# Patient Record
Sex: Female | Born: 1985 | Race: Black or African American | Hispanic: No | Marital: Single | Smoking: Former smoker
Health system: Southern US, Community
[De-identification: ages and names within clinical notes are randomized; demographics above are authoritative.]

## PROBLEM LIST (undated history)

## (undated) DIAGNOSIS — F329 Major depressive disorder, single episode, unspecified: Secondary | ICD-10-CM

## (undated) DIAGNOSIS — F319 Bipolar disorder, unspecified: Secondary | ICD-10-CM

## (undated) DIAGNOSIS — K219 Gastro-esophageal reflux disease without esophagitis: Secondary | ICD-10-CM

## (undated) DIAGNOSIS — F32A Depression, unspecified: Secondary | ICD-10-CM

## (undated) DIAGNOSIS — N809 Endometriosis, unspecified: Secondary | ICD-10-CM

---
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
## 2019-05-07 ENCOUNTER — Encounter (HOSPITAL_COMMUNITY): Payer: Self-pay | Admitting: Emergency Medicine

## 2019-05-07 ENCOUNTER — Emergency Department (HOSPITAL_COMMUNITY)
Admission: EM | Admit: 2019-05-07 | Discharge: 2019-05-07 | Disposition: A | Discharge status: Home / Self Care | Payer: Medicaid Other | Attending: Emergency Medicine | Admitting: Emergency Medicine

## 2019-05-07 ENCOUNTER — Other Ambulatory Visit: Payer: Self-pay

## 2019-05-07 DIAGNOSIS — R1010 Upper abdominal pain, unspecified: Secondary | ICD-10-CM | POA: Insufficient documentation

## 2019-05-07 DIAGNOSIS — N898 Other specified noninflammatory disorders of vagina: Secondary | ICD-10-CM | POA: Diagnosis not present

## 2019-05-07 DIAGNOSIS — R1030 Lower abdominal pain, unspecified: Secondary | ICD-10-CM | POA: Diagnosis not present

## 2019-05-07 DIAGNOSIS — Z87891 Personal history of nicotine dependence: Secondary | ICD-10-CM | POA: Insufficient documentation

## 2019-05-07 HISTORY — DX: Endometriosis, unspecified: N80.9

## 2019-05-07 HISTORY — DX: Gastro-esophageal reflux disease without esophagitis: K21.9

## 2019-05-07 HISTORY — DX: Bipolar disorder, unspecified: F31.9

## 2019-05-07 HISTORY — DX: Depression, unspecified: F32.A

## 2019-05-07 LAB — CBC
HCT: 42.5 % (ref 36.0–46.0)
Hemoglobin: 14.1 g/dL (ref 12.0–15.0)
MCH: 29 pg (ref 26.0–34.0)
MCHC: 33.2 g/dL (ref 30.0–36.0)
MCV: 87.3 fL (ref 80.0–100.0)
Platelets: 247 10*3/uL (ref 150–400)
RBC: 4.87 MIL/uL (ref 3.87–5.11)
RDW: 13.4 % (ref 11.5–15.5)
WBC: 9.1 10*3/uL (ref 4.0–10.5)
nRBC: 0 % (ref 0.0–0.2)

## 2019-05-07 LAB — COMPREHENSIVE METABOLIC PANEL
ALT: 18 U/L (ref 0–44)
AST: 17 U/L (ref 15–41)
Albumin: 4.6 g/dL (ref 3.5–5.0)
Alkaline Phosphatase: 85 U/L (ref 38–126)
Anion gap: 12 (ref 5–15)
BUN: 14 mg/dL (ref 6–20)
CO2: 24 mmol/L (ref 22–32)
Calcium: 9.2 mg/dL (ref 8.9–10.3)
Chloride: 105 mmol/L (ref 98–111)
Creatinine, Ser: 0.89 mg/dL (ref 0.44–1.00)
GFR calc Af Amer: >60 mL/min (ref >60)
GFR calc non Af Amer: >60 mL/min (ref >60)
Glucose, Bld: 130 mg/dL — ABNORMAL HIGH (ref 70–99)
Potassium: 3.9 mmol/L (ref 3.5–5.1)
Sodium: 141 mmol/L (ref 135–145)
Total Bilirubin: 0.5 mg/dL (ref 0.3–1.2)
Total Protein: 7.5 g/dL (ref 6.5–8.1)

## 2019-05-07 LAB — URINALYSIS, ROUTINE W REFLEX MICROSCOPIC
Bilirubin Urine: NEGATIVE
Glucose, UA: NEGATIVE mg/dL
Ketones, ur: NEGATIVE mg/dL
Leukocytes,Ua: NEGATIVE
Nitrite: NEGATIVE
Protein, ur: NEGATIVE mg/dL
Specific Gravity, Urine: 1.015 (ref 1.005–1.030)
pH: 7.5 (ref 5.0–8.0)

## 2019-05-07 LAB — URINALYSIS, MICROSCOPIC (REFLEX): RBC / HPF: >50 RBC/hpf (ref 0–5)

## 2019-05-07 LAB — I-STAT BETA HCG BLOOD, ED (MC, WL, AP ONLY): I-stat hCG, quantitative: <5 m[IU]/mL (ref <5)

## 2019-05-07 LAB — LIPASE, BLOOD: Lipase: 34 U/L (ref 11–51)

## 2019-05-07 MED ORDER — MORPHINE SULFATE (PF) 4 MG/ML IV SOLN
4.0000 mg | Freq: Once | INTRAVENOUS | Status: AC
  Administered 2019-05-07: 4 mg via INTRAVENOUS
  Filled 2019-05-07: qty 1

## 2019-05-07 MED ORDER — NAPROXEN 500 MG PO TABS: 500.0000 mg | ORAL_TABLET | Freq: Two times a day (BID) | ORAL | 0 refills | Status: AC

## 2019-05-07 MED ORDER — KETOROLAC TROMETHAMINE 15 MG/ML IJ SOLN
15.0000 mg | Freq: Once | INTRAMUSCULAR | Status: AC
  Administered 2019-05-07: 15 mg via INTRAVENOUS
  Filled 2019-05-07: qty 1

## 2019-05-07 MED ORDER — SODIUM CHLORIDE 0.9% FLUSH
3.0000 mL | Freq: Once | INTRAVENOUS | Status: AC
  Administered 2019-05-07: 3 mL via INTRAVENOUS

## 2019-05-07 MED ORDER — SODIUM CHLORIDE 0.9 % IV BOLUS
1000.0000 mL | Freq: Once | INTRAVENOUS | Status: AC
  Administered 2019-05-07: 11:00:00 1000 mL via INTRAVENOUS

## 2019-05-07 NOTE — ED Notes (Signed)
Patient verbalized understanding of discharge instructions and ambulated to lobby. Reports boyfriend will be driving her home.

## 2019-05-07 NOTE — Discharge Instructions (Addendum)
Your laboratory results were within normal limits.   I have provided medication to help with your pain, please take 1 tablet twice a day for the next 7 days.  I have provided the number to gynecology you will need to follow up with them for further management of your endometriosis.

## 2019-05-07 NOTE — ED Triage Notes (Signed)
abd pain x 3 days some nausea , vag bleeding  Upper denies dysuria ,

## 2019-05-07 NOTE — ED Provider Notes (Signed)
[facility] EMERGENCY DEPARTMENT Provider Note   CSN: [id number] Arrival date & time: 05/07/19  8841     History Chief Complaint  Patient presents with  . Abdominal Pain    Wanda Jordan is a 34 y.o. female.  34 y.o female with a PMH of endometriosis with a chief complaint of lower abdominal x but cramping on upper abdomen.  She had two prior laparoscopies for endometriosis, and third procedure for surgical removal of Mirena IUD. Brown discharge began Friday, heavy like I was starting period. LMP 02/02-02/07. Was told she needed a hysterectomy but recently relocated here from [place].   The history is provided by the patient.  Abdominal Pain Pain location:  Suprapubic and epigastric Pain quality: cramping   Pain radiates to:  Does not radiate Pain severity:  Moderate Onset quality:  Sudden Duration:  3 days Timing:  Constant Progression:  Worsening Chronicity:  Recurrent Context: previous surgery   Context: not sick contacts and not suspicious food intake   Relieved by:  Nothing Worsened by:  Eating Ineffective treatments:  Acetaminophen, NSAIDs and lying down Associated symptoms: vaginal discharge   Associated symptoms: no anorexia, no chest pain, no chills, no constipation, no diarrhea, no dysuria, no fever, no nausea, no shortness of breath and no sore throat   Risk factors: multiple surgeries   Risk factors: no alcohol abuse and not pregnant        Past Medical History:  Diagnosis Date  . Bipolar 1 disorder (HCC)   . Depression   . Endometriosis   . GERD (gastroesophageal reflux disease)     There are no problems to display for this patient.    OB History   No obstetric history on file.     No family history on file.  Social History   Tobacco Use  . Smoking status: Former Games developer  . Smokeless tobacco: Never Used  Substance Use Topics  . Alcohol use: Not Currently  . Drug use: Not Currently    Home Medications Prior to  Admission medications   Medication Sig Start Date End Date Taking? Authorizing Provider  naproxen (NAPROSYN) 500 MG tablet Take 1 tablet (500 mg total) by mouth 2 (two) times daily for 7 days. 05/07/19 05/14/19  Claude Manges, PA-C    Allergies    Benadryl [diphenhydramine]  Review of Systems   Review of Systems  Constitutional: Negative for chills and fever.  HENT: Negative for sore throat.   Respiratory: Negative for shortness of breath.   Cardiovascular: Negative for chest pain.  Gastrointestinal: Positive for abdominal pain. Negative for anorexia, constipation, diarrhea and nausea.  Genitourinary: Positive for vaginal discharge. Negative for dysuria.  All other systems reviewed and are negative.   Physical Exam Updated Vital Signs BP 110/76 (BP Location: Left Arm)   Pulse 89   Temp 98.4 F (36.9 C) (Oral)   Resp 16   Ht 5\' 4"  (1.626 m)   Wt 63.5 kg   SpO2 100%   BMI 24.03 kg/m   Physical Exam Vitals and nursing note reviewed.  Constitutional:      General: She is not in acute distress.    Appearance: She is well-developed.     Comments: Appears uncomfortable.   HENT:     Head: Normocephalic and atraumatic.     Mouth/Throat:     Pharynx: No oropharyngeal exudate.  Eyes:     Pupils: Pupils are equal, round, and reactive to light.  Cardiovascular:     Rate and  Rhythm: Regular rhythm.     Heart sounds: Normal heart sounds.  Pulmonary:     Effort: Pulmonary effort is normal. No respiratory distress.     Breath sounds: Normal breath sounds.  Abdominal:     General: Abdomen is flat. Bowel sounds are normal. There is no distension.     Palpations: Abdomen is soft. There is no hepatomegaly or splenomegaly.     Tenderness: There is generalized abdominal tenderness and tenderness in the suprapubic area. There is no right CVA tenderness, left CVA tenderness, guarding or rebound.     Hernia: No hernia is present.     Comments: Soft but ttp along the suprapubic region.     Musculoskeletal:        General: No tenderness or deformity.     Cervical back: Normal range of motion.     Right lower leg: No edema.     Left lower leg: No edema.  Skin:    General: Skin is warm and dry.  Neurological:     Mental Status: She is alert and oriented to person, place, and time.     ED Results / Procedures / Treatments   Labs (all labs ordered are listed, but only abnormal results are displayed) Labs Reviewed  COMPREHENSIVE METABOLIC PANEL - Abnormal; Notable for the following components:      Result Value   Glucose, Bld 130 (*)    All other components within normal limits  URINALYSIS, ROUTINE W REFLEX MICROSCOPIC - Abnormal; Notable for the following components:   APPearance CLOUDY (*)    Hgb urine dipstick LARGE (*)    All other components within normal limits  URINALYSIS, MICROSCOPIC (REFLEX) - Abnormal; Notable for the following components:   Bacteria, UA FEW (*)    All other components within normal limits  LIPASE, BLOOD  CBC  I-STAT BETA HCG BLOOD, ED ([facility], [facility], [facility] ONLY)    EKG None  Radiology No results found.  Procedures Procedures (including critical care time)  Medications Ordered in ED Medications  sodium chloride flush (NS) 0.9 % injection 3 mL (3 mLs Intravenous Given 05/07/19 1100)  ketorolac (TORADOL) 15 MG/ML injection 15 mg (15 mg Intravenous Given 05/07/19 1059)  sodium chloride 0.9 % bolus 1,000 mL (0 mLs Intravenous Stopped 05/07/19 1212)  morphine 4 MG/ML injection 4 mg (4 mg Intravenous Given 05/07/19 1239)    ED Course  I have reviewed the triage vital signs and the nursing notes.  Pertinent labs & imaging results that were available during my care of the patient were reviewed by me and considered in my medical decision making (see chart for details).    MDM Rules/Calculators/A&P   Patient with no PMH presents to the ED with a complaints of " endometriosis flareup ".  She does report she has been under a lot of stress and this  feels similar to her prior endometriosis flareup.  [place]  She does have a prior history of surgical laparoscopies for evaluation of endometriosis, was told she would need a hysterectomy at some point.  Has not gotten this procedure scheduled.  She also endorses some vaginal discharge, this began with brown heavy clots on Friday, reports this has worsened as she feels more lightheaded and tired now.  She has had some Tylenol, naproxen for her pain without improvement in her symptoms. She denies any nausea, vomiting, or diarrhea. Last BM yesterday. Currently sexually active, but had tubal ligation for birth control.  Interpretation of labs with a CBC is unremarkable, no signs of anemia despite her vaginal bleeding. CMP with no electrolyte abnormality.  Creatinine level is within normal limits.  UA with some large hemoglobin but no nitrites or leukocytes.  Few bacteria were present.  hCG is negative.  Attempted to perform a pelvic exam on patient however she declined this at this time stating " I am gay and do not want this".  She reports pain along the lower aspect, provided with Toradol reports pain has improved some.  12:33 PM Patient has refused pelvic along. I have lower suspicion of torsion as pain is recurrent as she reports the same as her prior endometriosis.  There has been no nausea, no vomiting, lower suspicion for any GI pathology as she has been eating and drinking adequately as she reports.  No fevers, overall well-appearing.  I feel that is reasonable to send patient home with a prescription for naproxen along with follow-up with GYN.  Patient is in agreement management, return precautions discussed at length.   Portions of this note were generated with Scientist, clinical (histocompatibility and immunogenetics). Dictation errors may occur despite best attempts at proofreading.  Final Clinical Impression(s) / ED Diagnoses Final diagnoses:  Lower abdominal pain    Rx / DC Orders ED  Discharge Orders         Ordered    naproxen (NAPROSYN) 500 MG tablet  2 times daily     05/07/19 1232           Claude Manges, PA-C 05/07/19 1320    Gwyneth Sprout, MD 05/07/19 1503

## 2019-07-14 ENCOUNTER — Other Ambulatory Visit: Payer: Self-pay

## 2019-07-14 ENCOUNTER — Encounter (HOSPITAL_COMMUNITY): Payer: Self-pay | Admitting: Emergency Medicine

## 2019-07-14 ENCOUNTER — Emergency Department (HOSPITAL_COMMUNITY)
Admission: EM | Admit: 2019-07-14 | Discharge: 2019-07-15 | Disposition: A | Discharge status: Home / Self Care | Payer: Medicaid Other | Attending: Emergency Medicine | Admitting: Emergency Medicine

## 2019-07-14 DIAGNOSIS — R9389 Abnormal findings on diagnostic imaging of other specified body structures: Secondary | ICD-10-CM

## 2019-07-14 DIAGNOSIS — N83201 Unspecified ovarian cyst, right side: Secondary | ICD-10-CM | POA: Insufficient documentation

## 2019-07-14 DIAGNOSIS — R102 Pelvic and perineal pain: Secondary | ICD-10-CM | POA: Diagnosis not present

## 2019-07-14 DIAGNOSIS — Z87891 Personal history of nicotine dependence: Secondary | ICD-10-CM | POA: Diagnosis not present

## 2019-07-14 DIAGNOSIS — K6289 Other specified diseases of anus and rectum: Secondary | ICD-10-CM | POA: Insufficient documentation

## 2019-07-14 DIAGNOSIS — R1900 Intra-abdominal and pelvic swelling, mass and lump, unspecified site: Secondary | ICD-10-CM

## 2019-07-14 NOTE — ED Triage Notes (Signed)
jpt c/o rectal pain since last Monday, was seen on UC  for same and sent home with prescription for Hydrocodone that is not helping.

## 2019-07-15 ENCOUNTER — Emergency Department (HOSPITAL_COMMUNITY): Payer: Medicaid Other

## 2019-07-15 ENCOUNTER — Encounter (HOSPITAL_COMMUNITY): Payer: Self-pay | Admitting: Radiology

## 2019-07-15 LAB — CBC WITH DIFFERENTIAL/PLATELET
Abs Immature Granulocytes: 0.03 10*3/uL (ref 0.00–0.07)
Basophils Absolute: 0.1 10*3/uL (ref 0.0–0.1)
Basophils Relative: 1 %
Eosinophils Absolute: 0.2 10*3/uL (ref 0.0–0.5)
Eosinophils Relative: 2 %
HCT: 39.7 % (ref 36.0–46.0)
Hemoglobin: 12.9 g/dL (ref 12.0–15.0)
Immature Granulocytes: 0 %
Lymphocytes Relative: 35 %
Lymphs Abs: 3.5 10*3/uL (ref 0.7–4.0)
MCH: 28.5 pg (ref 26.0–34.0)
MCHC: 32.5 g/dL (ref 30.0–36.0)
MCV: 87.6 fL (ref 80.0–100.0)
Monocytes Absolute: 0.6 10*3/uL (ref 0.1–1.0)
Monocytes Relative: 6 %
Neutro Abs: 5.4 10*3/uL (ref 1.7–7.7)
Neutrophils Relative %: 56 %
Platelets: 184 10*3/uL (ref 150–400)
RBC: 4.53 MIL/uL (ref 3.87–5.11)
RDW: 13.6 % (ref 11.5–15.5)
WBC: 9.7 10*3/uL (ref 4.0–10.5)
nRBC: 0 % (ref 0.0–0.2)

## 2019-07-15 LAB — COMPREHENSIVE METABOLIC PANEL
ALT: 12 U/L (ref 0–44)
AST: 13 U/L — ABNORMAL LOW (ref 15–41)
Albumin: 4.1 g/dL (ref 3.5–5.0)
Alkaline Phosphatase: 73 U/L (ref 38–126)
Anion gap: 10 (ref 5–15)
BUN: 16 mg/dL (ref 6–20)
CO2: 21 mmol/L — ABNORMAL LOW (ref 22–32)
Calcium: 9.1 mg/dL (ref 8.9–10.3)
Chloride: 109 mmol/L (ref 98–111)
Creatinine, Ser: 0.9 mg/dL (ref 0.44–1.00)
GFR calc Af Amer: >60 mL/min (ref >60)
GFR calc non Af Amer: >60 mL/min (ref >60)
Glucose, Bld: 108 mg/dL — ABNORMAL HIGH (ref 70–99)
Potassium: 3.9 mmol/L (ref 3.5–5.1)
Sodium: 140 mmol/L (ref 135–145)
Total Bilirubin: 0.5 mg/dL (ref 0.3–1.2)
Total Protein: 6.8 g/dL (ref 6.5–8.1)

## 2019-07-15 LAB — URINALYSIS, ROUTINE W REFLEX MICROSCOPIC
Bilirubin Urine: NEGATIVE
Glucose, UA: NEGATIVE mg/dL
Hgb urine dipstick: NEGATIVE
Ketones, ur: 5 mg/dL — AB
Leukocytes,Ua: NEGATIVE
Nitrite: NEGATIVE
Protein, ur: NEGATIVE mg/dL
Specific Gravity, Urine: 1.026 (ref 1.005–1.030)
pH: 6 (ref 5.0–8.0)

## 2019-07-15 LAB — LIPASE, BLOOD: Lipase: 33 U/L (ref 11–51)

## 2019-07-15 LAB — PREGNANCY, URINE: Preg Test, Ur: NEGATIVE

## 2019-07-15 MED ORDER — KETOROLAC TROMETHAMINE 30 MG/ML IJ SOLN
30.0000 mg | Freq: Once | INTRAMUSCULAR | Status: AC
  Administered 2019-07-15: 10:00:00 30 mg via INTRAVENOUS
  Filled 2019-07-15: qty 1

## 2019-07-15 MED ORDER — OXYCODONE-ACETAMINOPHEN 5-325 MG PO TABS
2.0000 | ORAL_TABLET | Freq: Once | ORAL | Status: AC
  Administered 2019-07-15: 2 via ORAL
  Filled 2019-07-15: qty 2

## 2019-07-15 MED ORDER — MORPHINE SULFATE (PF) 4 MG/ML IV SOLN
4.0000 mg | Freq: Once | INTRAVENOUS | Status: AC
  Administered 2019-07-15: 4 mg via INTRAVENOUS
  Filled 2019-07-15: qty 1

## 2019-07-15 MED ORDER — IOHEXOL 300 MG/ML  SOLN
100.0000 mL | Freq: Once | INTRAMUSCULAR | Status: AC | PRN
  Administered 2019-07-15: 100 mL via INTRAVENOUS

## 2019-07-15 MED ORDER — SODIUM CHLORIDE 0.9 % IV BOLUS (SEPSIS)
1000.0000 mL | Freq: Once | INTRAVENOUS | Status: AC
  Administered 2019-07-15: 1000 mL via INTRAVENOUS

## 2019-07-15 MED ORDER — ONDANSETRON HCL 4 MG/2ML IJ SOLN
4.0000 mg | Freq: Once | INTRAMUSCULAR | Status: AC
  Administered 2019-07-15: 4 mg via INTRAVENOUS
  Filled 2019-07-15: qty 2

## 2019-07-15 NOTE — Discharge Instructions (Addendum)
Please use Tylenol and ibuprofen as needed for pain Call your OB/GYN for follow-up Return to the emergency department if you are having severe pain, fever, or worsening symptoms

## 2019-07-15 NOTE — ED Provider Notes (Signed)
  Physical Exam  BP 138/74 (BP Location: Right Arm)   Pulse (!) 102   Temp 99 F (37.2 C) (Oral)   Resp 18   Ht 1.626 m (5\' 4" )   Wt 61.2 kg   SpO2 100%   BMI 23.16 kg/m   Physical Exam  ED Course/Procedures     Procedures  MDM  Received as handoff at shift change from Dr. .  She presented complaining of rectal pain.  CT obtained shows a deep pelvic mass for which ultrasound is being obtained for further evaluation.  Plan probable discharge with outpatient follow-up Reviewed ultrasound.  There is a hemorrhagic corpus luteum.  Discussed with Dr. Elesa Massed and this correlates with finding on CT. Discussed with patient and advised regarding follow-up with OB/GYN.      Pia Mau, MD 07/15/19 1056

## 2019-07-15 NOTE — ED Notes (Signed)
Pt transported to Ultrasound.  

## 2019-07-15 NOTE — ED Notes (Signed)
Pt returned to room from ultrasound.

## 2019-07-15 NOTE — ED Provider Notes (Addendum)
TIME SEEN: 5:11 AM  CHIEF COMPLAINT: Rectal pain, lower abdominal pain  HPI: Patient is a 34 year old female with history of endometriosis, bipolar disorder who presents to the emergency department with several days of rectal pain, lower abdominal cramping.  States pain initially started on Monday with episodes of diarrhea.  States now she feels she cannot have a bowel movement or pass gas.  No history of bowel obstruction but has had 2 previous laparoscopic surgeries for endometriosis.  She does report she has had nausea and vomiting.  Vomit has been nonbilious, nonbloody.  No fevers but feels she has had chills.  No bloody stools or melena.  No history of perianal or perirectal abscess.  No dysuria, hematuria, vaginal bleeding or discharge.  She is a G4 P3 A1.  No history of STDs.  Was seen at urgent care and given Anusol which she has been using without relief.  Does have history of hemorrhoids.  ROS: See HPI Constitutional: no fever  Eyes: no drainage  ENT: no runny nose   Cardiovascular:  no chest pain  Resp: no SOB  GI:  vomiting GU: no dysuria Integumentary: no rash  Allergy: no hives  Musculoskeletal: no leg swelling  Neurological: no slurred speech ROS otherwise negative  PAST MEDICAL HISTORY/PAST SURGICAL HISTORY:  Past Medical History:  Diagnosis Date  . Bipolar 1 disorder (HCC)   . Depression   . Endometriosis   . GERD (gastroesophageal reflux disease)     MEDICATIONS:  Prior to Admission medications   Not on File    ALLERGIES:  Allergies  Allergen Reactions  . Benadryl [Diphenhydramine]     twitches    SOCIAL HISTORY:  Social History   Tobacco Use  . Smoking status: Former Games developer  . Smokeless tobacco: Never Used  Substance Use Topics  . Alcohol use: Not Currently    FAMILY HISTORY: No family history on file.  EXAM: BP (!) 115/92 (BP Location: Right Arm)   Pulse (!) 107   Temp 99 F (37.2 C) (Oral)   Resp 18   Ht 5\' 4"  (1.626 m)   Wt 61.2 kg    SpO2 100%   BMI 23.16 kg/m  CONSTITUTIONAL: Alert and oriented and responds appropriately to questions.  Appears uncomfortable but not in distress.  Normal rectal temperature of 98.8. HEAD: Normocephalic EYES: Conjunctivae clear, pupils appear equal, EOM appear intact ENT: normal nose; moist mucous membranes NECK: Supple, normal ROM CARD: RRR; S1 and S2 appreciated; no murmurs, no clicks, no rubs, no gallops RESP: Normal chest excursion without splinting or tachypnea; breath sounds clear and equal bilaterally; no wheezes, no rhonchi, no rales, no hypoxia or respiratory distress, speaking full sentences ABD/GI: Normal bowel sounds; non-distended; soft, mildly tender throughout the lower abdomen, no rebound, no guarding, no peritoneal signs, no hepatosplenomegaly RECTAL:  Normal rectal tone, patient has tenderness on rectal exam without redness, warmth, fluctuance, crepitus, ecchymosis, necrosis.  She has no fecal impaction.  Small amount of brown stool without blood or melena noted.  She has 1 small nonthrombosed, nonbleeding, nontender external hemorrhoid on exam.  Normal-appearing perineum.  Rectal temp 98.8. BACK:  The back appears normal EXT: Normal ROM in all joints; no deformity noted, no edema; no cyanosis SKIN: Normal color for age and race; warm; no rash on exposed skin NEURO: Moves all extremities equally PSYCH: The patient's mood and manner are appropriate.   MEDICAL DECISION MAKING: Patient here with rectal pain.  Differential includes perirectal abscess, perianal abscess.  No sign  of necrotizing fasciitis at this time.  She is also having lower abdominal tenderness.  Differential includes SBO, diverticulitis, colitis, appendicitis, torsion, TOA, PID, UTI, pregnancy.  Will obtain labs, urine and CT of abdomen pelvis.  Will give IV pain and nausea medicine.  ED PROGRESS: Labs reviewed/interpreted and show no significant abnormality.  No leukocytosis.  Normal LFTs, creatinine, lipase.   Urine reviewed/interpreted and shows no sign of infection and pregnancy test is negative.  CT of abdomen pelvis reviewed/interpreted and shows peripherally enhancing 2.4 cm right deep pelvic mass located between the uterus and rectum with small volume complex free fluid in the pelvic cul-de-sac that likely represents a ruptured right ovarian corpus luteum with physiologic hemoperitoneum.  Recommend pelvic ultrasound.  Patient again denies any abnormal vaginal bleeding, discharge.  She states she does have irregular menstrual cycles due to her endometriosis.  Still having some abdominal pain.  Will give dose of Toradol.  Transvaginal ultrasound pending.  I signed out to Dr. Jeanell Sparrow to follow-up on imaging.   I reviewed all nursing notes and pertinent previous records as available.  I have reviewed and interpreted any EKGs, lab and urine results, imaging (as available).   Edna Haig was evaluated in Emergency Department on 07/15/2019 for the symptoms described in the history of present illness. She was evaluated in the context of the global COVID-19 pandemic, which necessitated consideration that the patient might be at risk for infection with the SARS-CoV-2 virus that causes COVID-19. Institutional protocols and algorithms that pertain to the evaluation of patients at risk for COVID-19 are in a state of rapid change based on information released by regulatory bodies including the CDC and federal and state organizations. These policies and algorithms were followed during the patient's care in the ED.      Sari Cogan, Delice Bison, DO 07/15/19 0908    Shaynna Husby, Delice Bison, DO 07/15/19 [id number]

## 2021-02-18 IMAGING — CT CT ABD-PELV W/ CM
2 of 4 series · 12 of 46 positions shown, 14 images · IV contrast (APPLIED)
Comparison: None.

CLINICAL DATA: Rectal pain, lower abdominal cramping, chills,
nausea and vomiting.

EXAM:
CT ABDOMEN AND PELVIS WITH CONTRAST
TECHNIQUE: Multidetector CT imaging of the abdomen and pelvis was performed
using the standard protocol following bolus administration of
intravenous contrast. Patient scanned in the prone position due to
inability to tolerate supine position due to pain.
CONTRAST:  100mL OMNIPAQUE IOHEXOL 300 MG/ML  SOLN

[Series 3: abdomen 5.0 · axial · 0.54mm/px · z∈[-531,-113]mm · 9 of 96 slices shown, 11 images]
[im 6/96  soft-tissue]
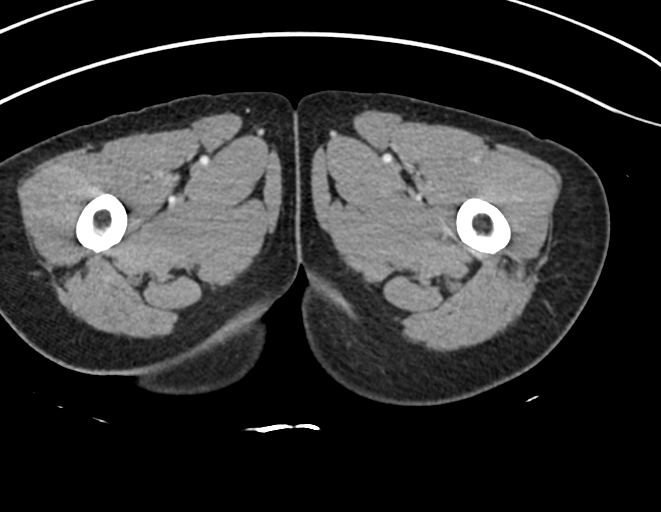
[im 6/96  bone]
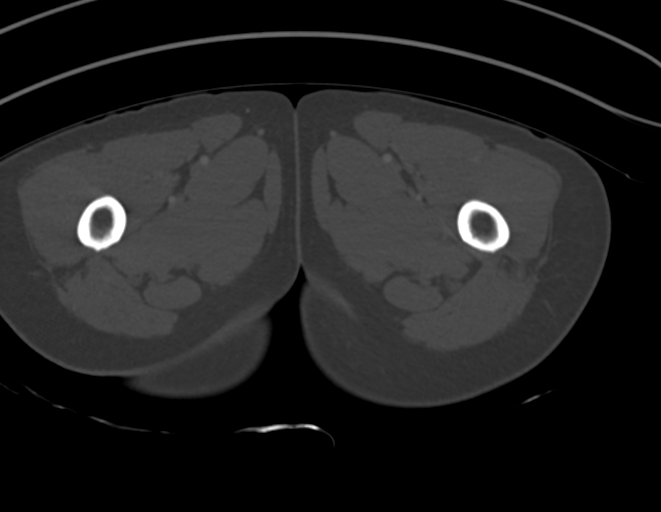
[im 18/96  soft-tissue]
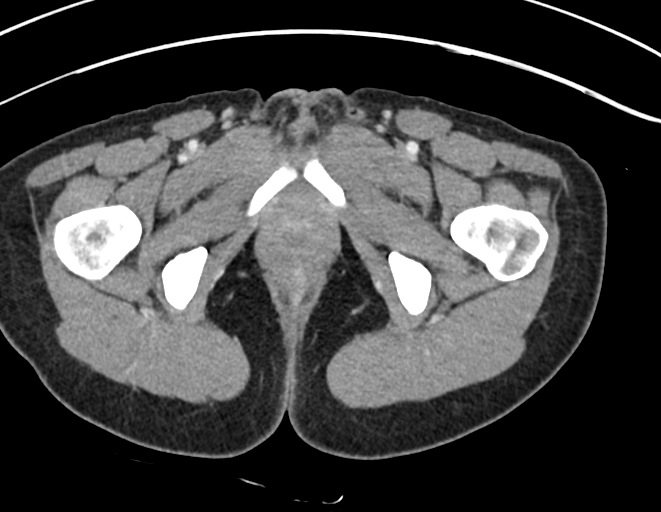
[im 30/96  soft-tissue]
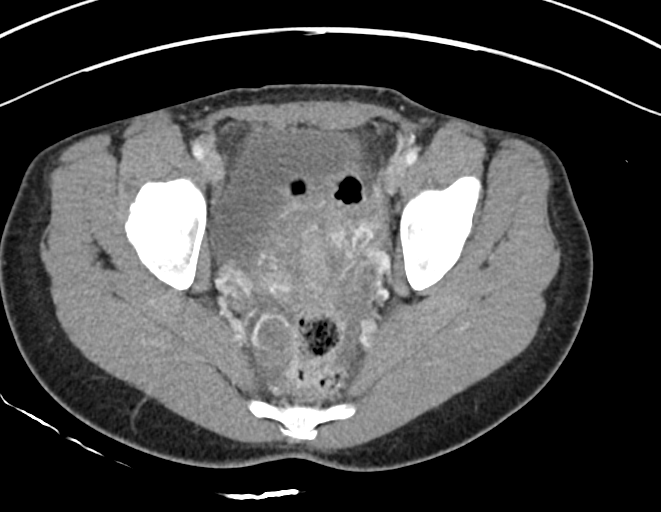
[im 36/96  soft-tissue]
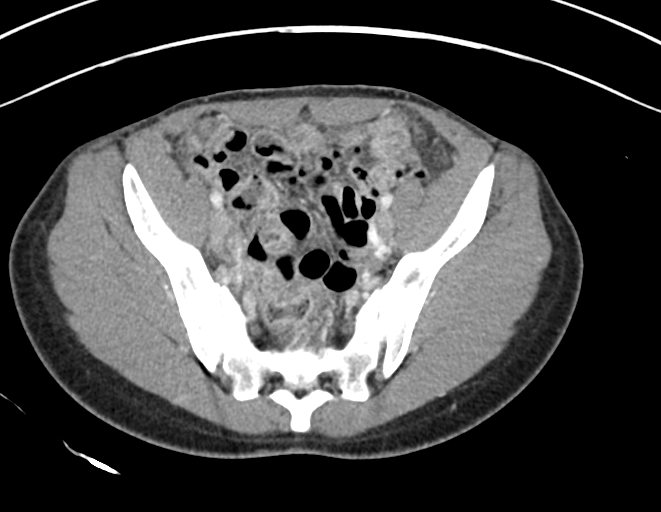
[im 48/96  soft-tissue]
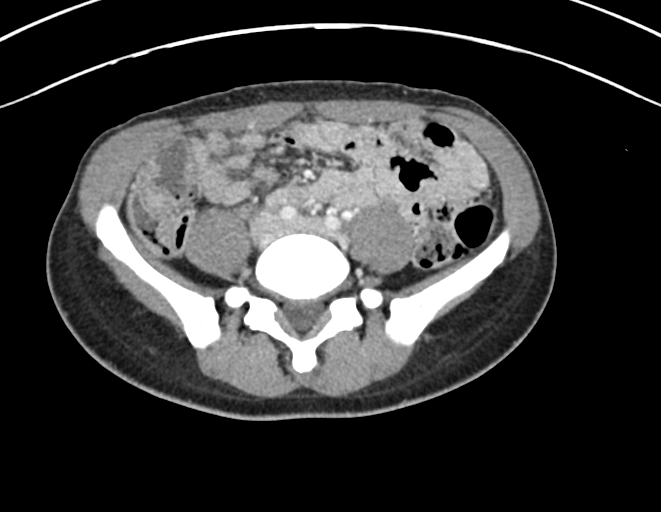
[im 60/96  soft-tissue]
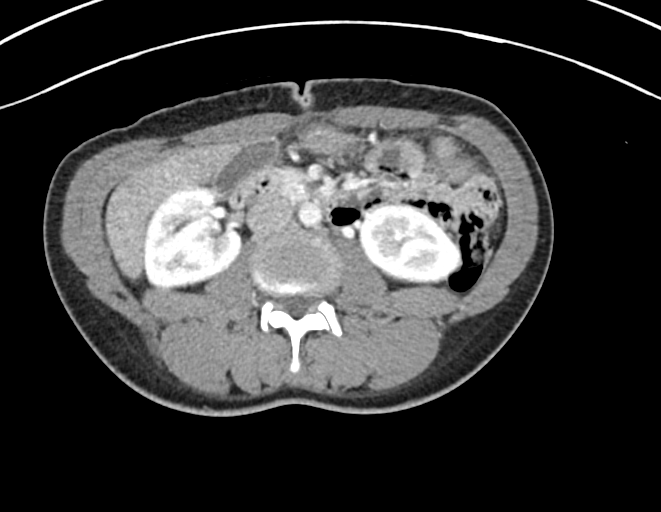
[im 66/96  soft-tissue]
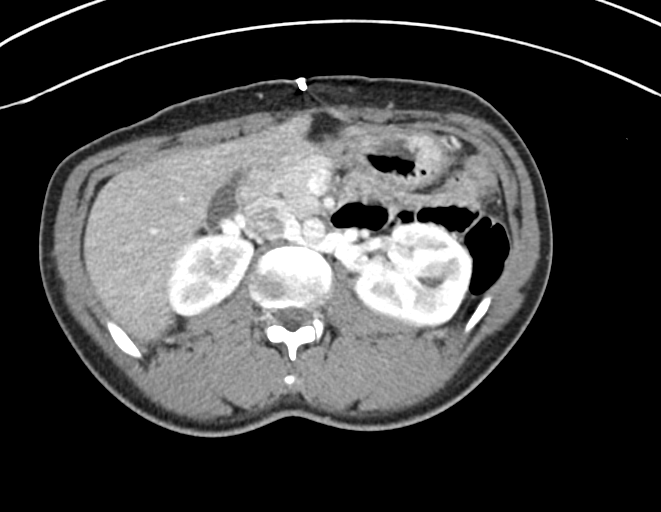
[im 78/96  soft-tissue]
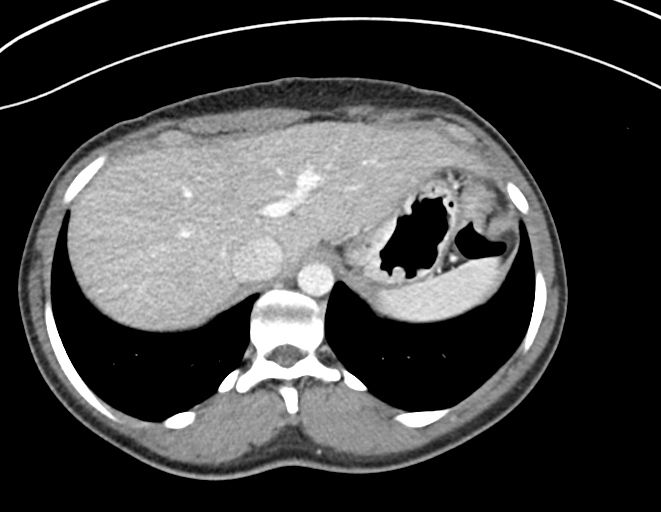
[im 90/96  soft-tissue]
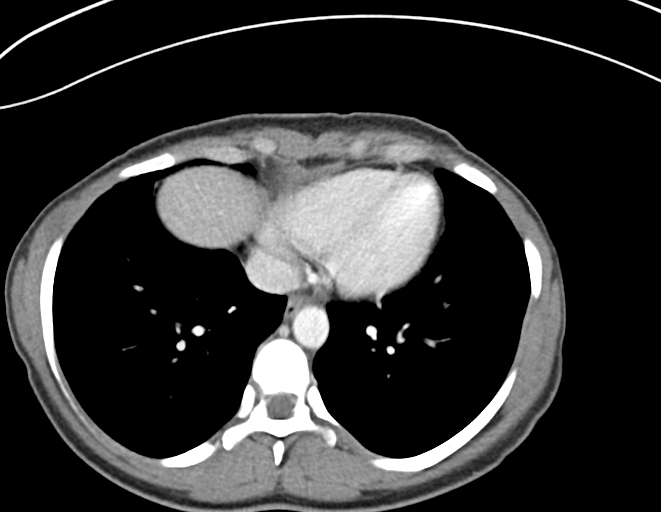
[im 90/96  bone]
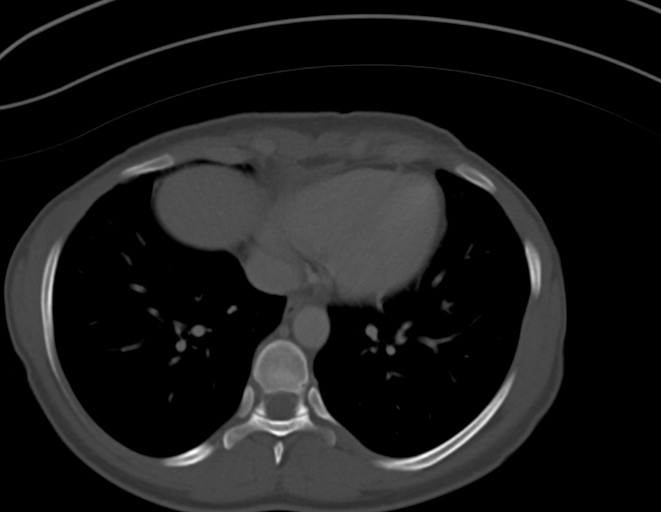

[Series 6: abdomen 3.0 mpr cor · coronal · 0.75mm/px · 3 of 74 slices shown]
[im 25/74  soft-tissue]
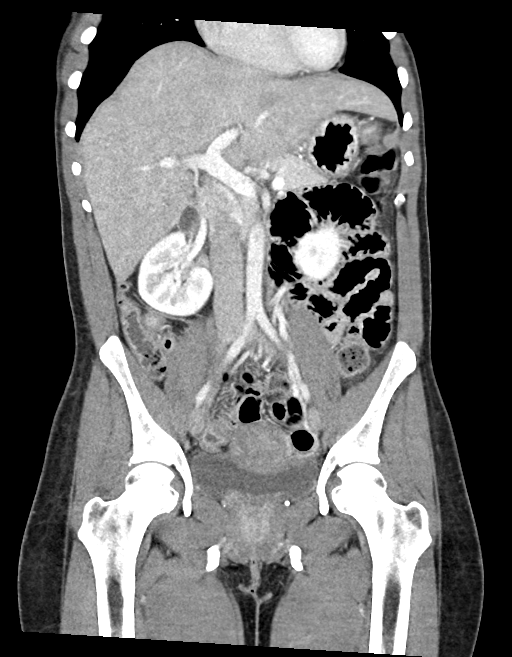
[im 33/74  soft-tissue]
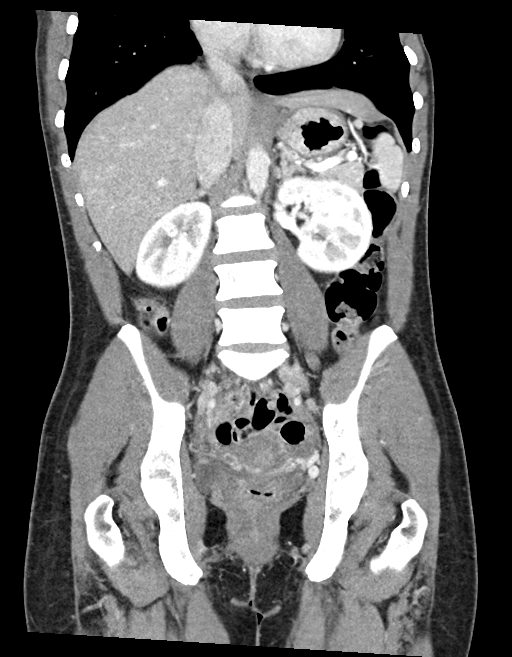
[im 41/74  soft-tissue]
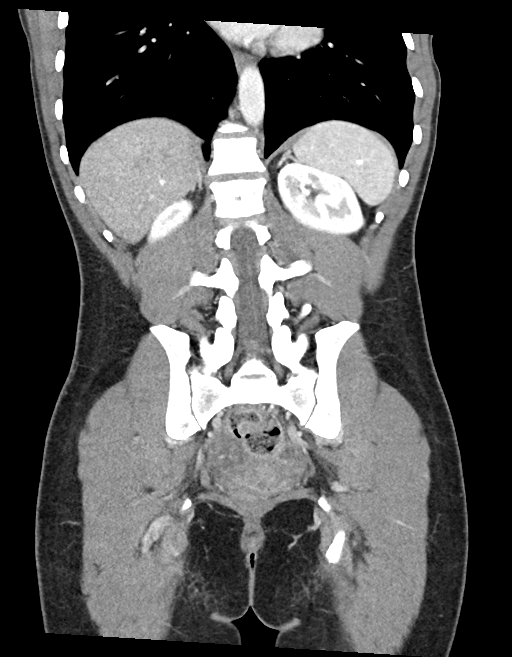

[12 of 46 positions shown; findings below may reference images not displayed]

FINDINGS: Lower chest: No significant pulmonary nodules or acute consolidative
airspace disease.

Hepatobiliary: Normal liver size. Hypervascular 1.5 cm left liver
dome mass (series 3/image 10). No additional liver lesions. Normal
gallbladder with no radiopaque cholelithiasis. No biliary ductal
dilatation.

Pancreas: Normal, with no mass or duct dilation.

Spleen: Normal size. No mass.

Adrenals/Urinary Tract: Normal adrenals. Normal kidneys with no
hydronephrosis and no renal mass. Normal bladder.

Stomach/Bowel: Normal non-distended stomach. Normal caliber small
bowel with no small bowel wall thickening. Normal appendix. Normal
large bowel with no diverticulosis, large bowel wall thickening or
pericolonic fat stranding.

Vascular/Lymphatic: Normal caliber abdominal aorta. Patent portal,
splenic and renal veins. Retroaortic left renal vein. No
pathologically enlarged lymph nodes in the abdomen or pelvis.

Reproductive: Grossly normal anteverted uterus. Peripherally
enhancing 2.4 x 2.2 cm right deep pelvic mass (series 3/image 68),
located posterior to the right lower uterus and anterior to the
right rectum. No left adnexal mass.

Other: No pneumoperitoneum. Small volume complex free fluid in
pelvic cul-de-sac. No focal fluid collection.

Musculoskeletal: No aggressive appearing focal osseous lesions.
IMPRESSION: 1. Peripherally enhancing 2.4 cm right deep pelvic mass, located
between the uterus and rectum. Small volume complex free fluid in
the pelvic cul-de-sac. Findings likely represent a ruptured right
ovarian corpus luteum with physiologic hemoperitoneum in the pelvis.
Pelvic ultrasound correlation could be obtained as clinically
warranted.
2. Hypervascular 1.5 cm left liver dome mass, indeterminate, most
likely benign. Recommend follow-up outpatient MRI abdomen without
and with IV contrast.

## 2021-02-18 IMAGING — US US PELVIS COMPLETE TRANSABD/TRANSVAG W DUPLEX
2 series · 13 of 25 positions shown · non-contrast
Comparison: None

CLINICAL DATA: Abnormal CT with deep RIGHT pelvic mass

EXAM:
TRANSABDOMINAL AND TRANSVAGINAL ULTRASOUND OF PELVIS
DOPPLER ULTRASOUND OF OVARIES
TECHNIQUE: Both transabdominal and transvaginal ultrasound examinations of the
pelvis were performed. Transabdominal technique was performed for
global imaging of the pelvis including uterus, ovaries, adnexal
regions, and pelvic cul-de-sac.
It was necessary to proceed with endovaginal exam following the
transabdominal exam to visualize the uterus, endometrium, and
ovaries. Color and duplex Doppler ultrasound was utilized to
evaluate blood flow to the ovaries.

[Series 1: us pelvis complete transabd/transvag w duplex · 12 of 41 slices shown (1 of 2)]
[im 1/41]
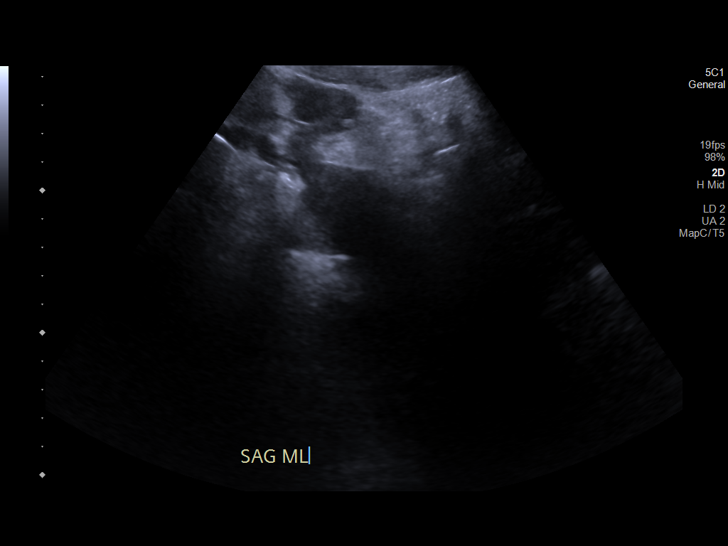
[im 4/41]
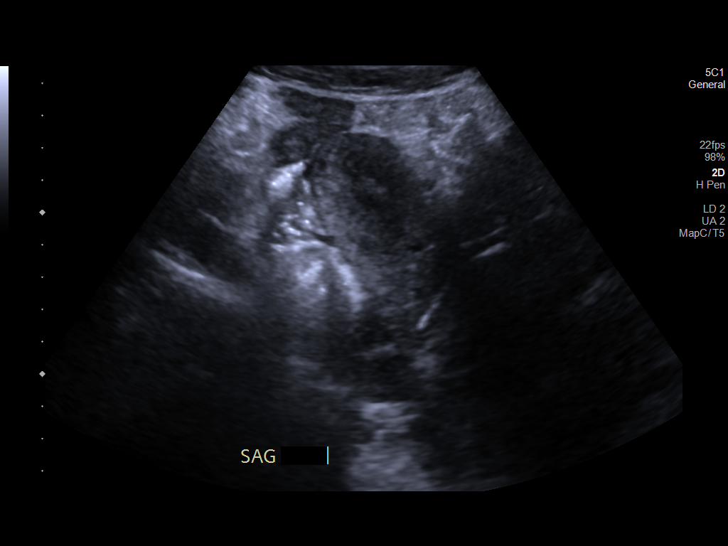
[im 7/41]
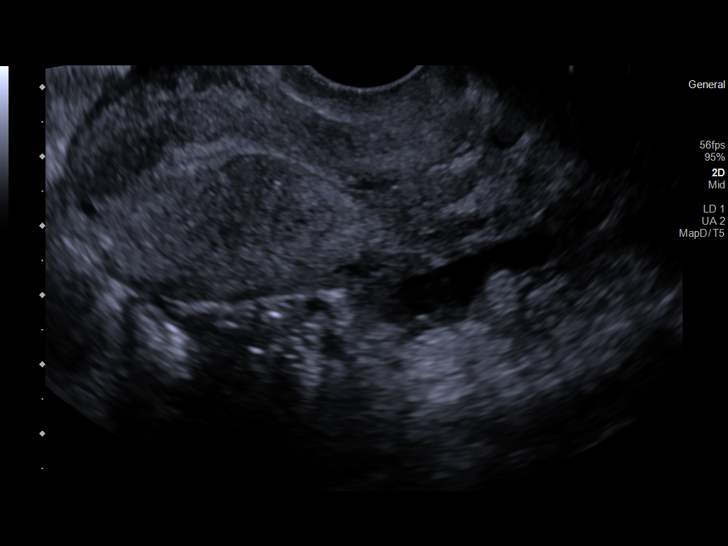
[im 11/41]
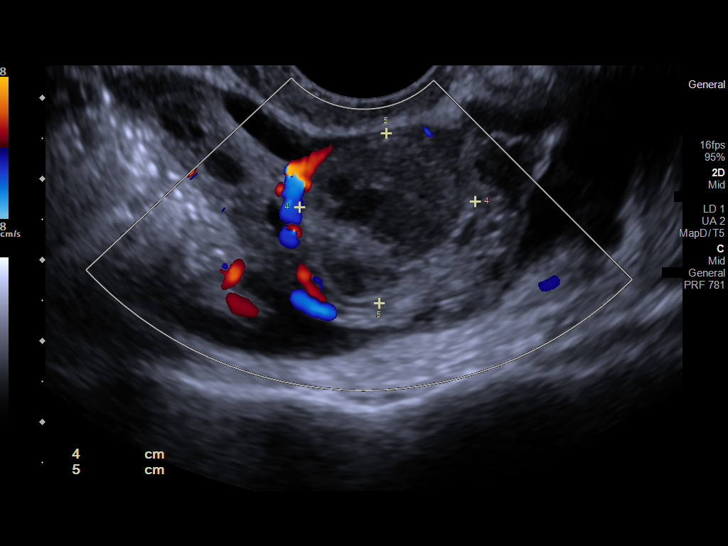
[im 14/41]
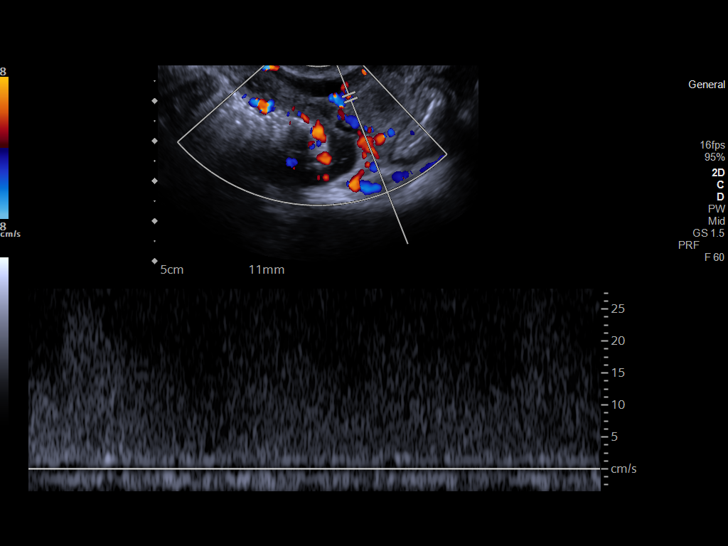
[im 18/41]
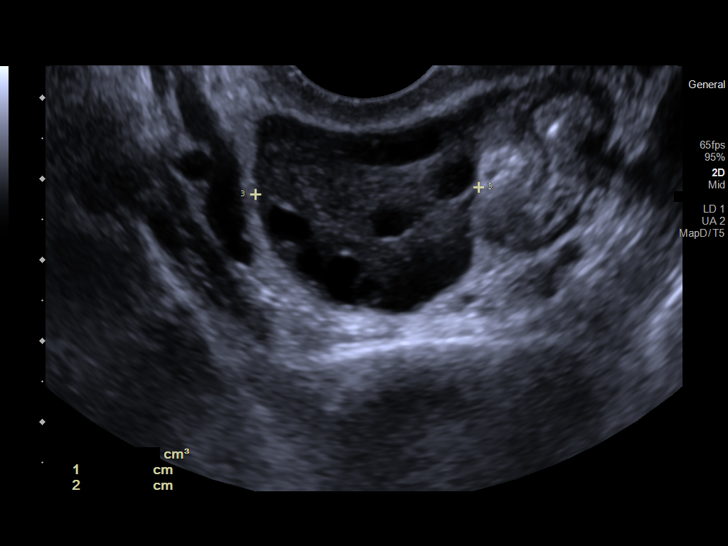
[im 21/41]
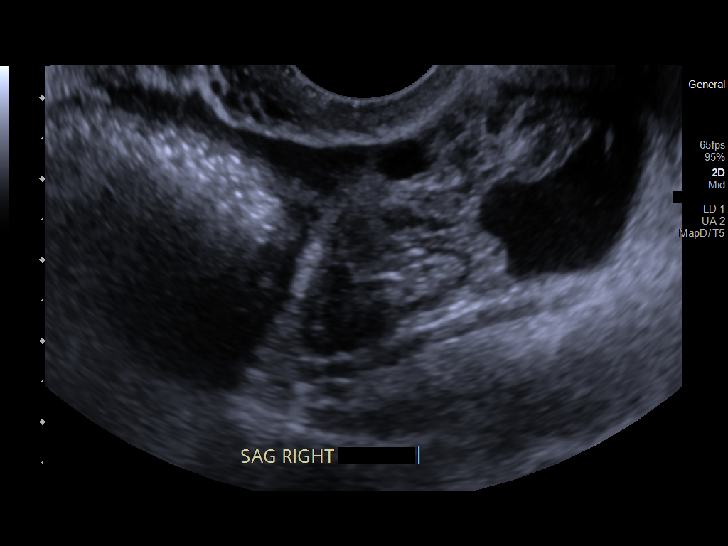
[im 25/41]
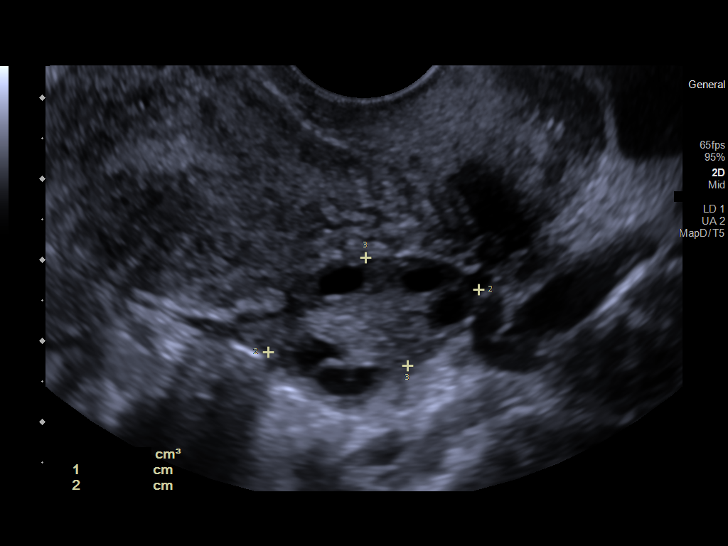
[im 28/41]
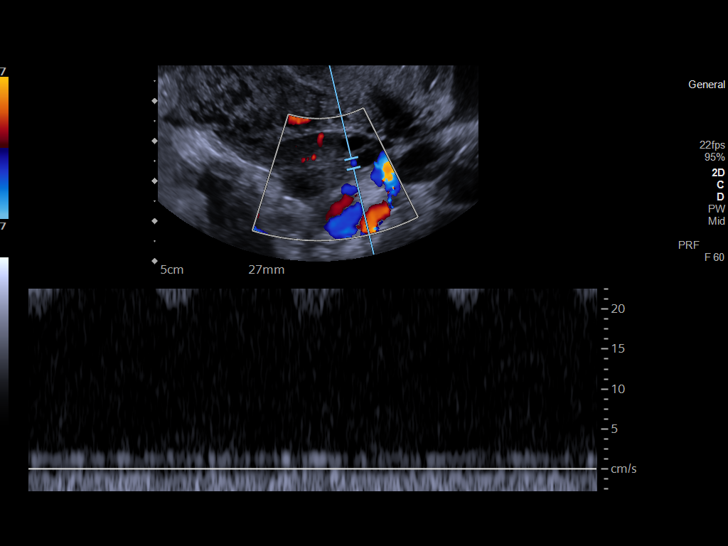
[im 32/41]
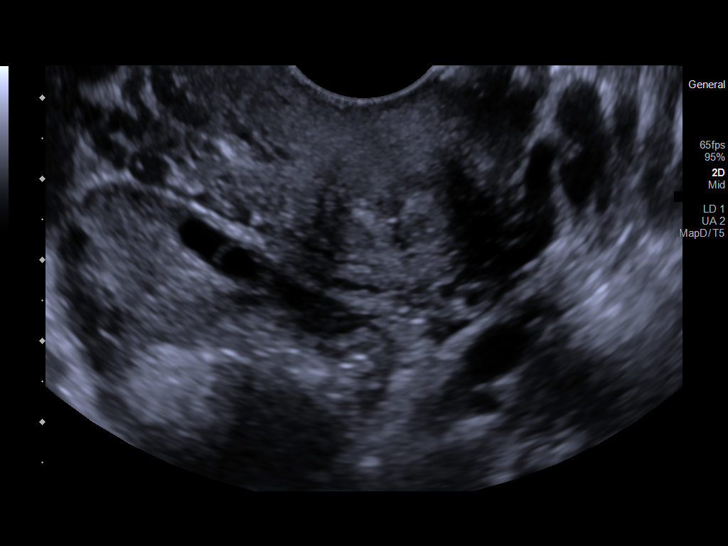
[im 35/41]
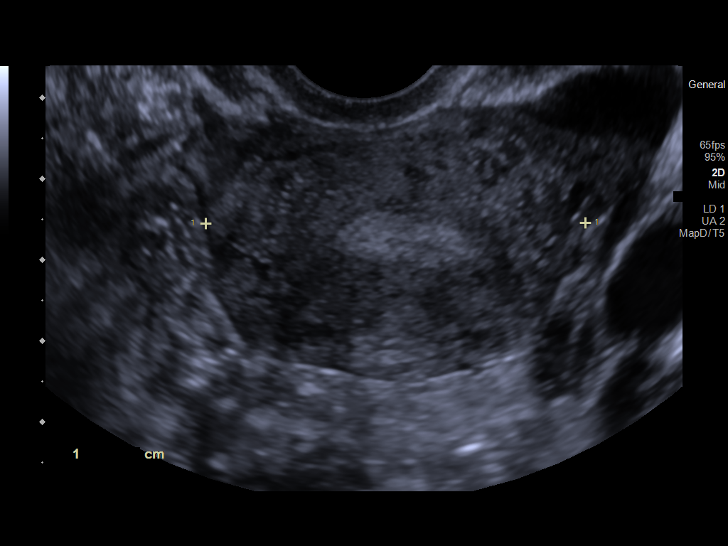
[im 39/41]
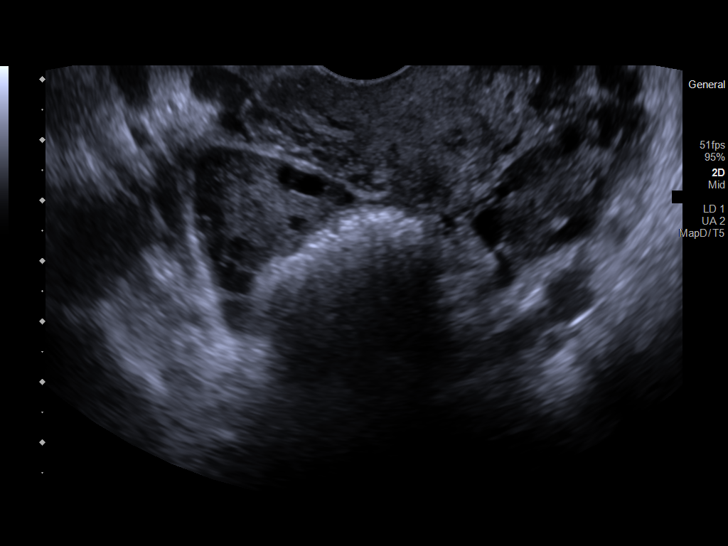

[Series 2: us pelvis complete transabd/transvag w duplex · 1 of 1 slices shown (2 of 2)]
[im 1/1]
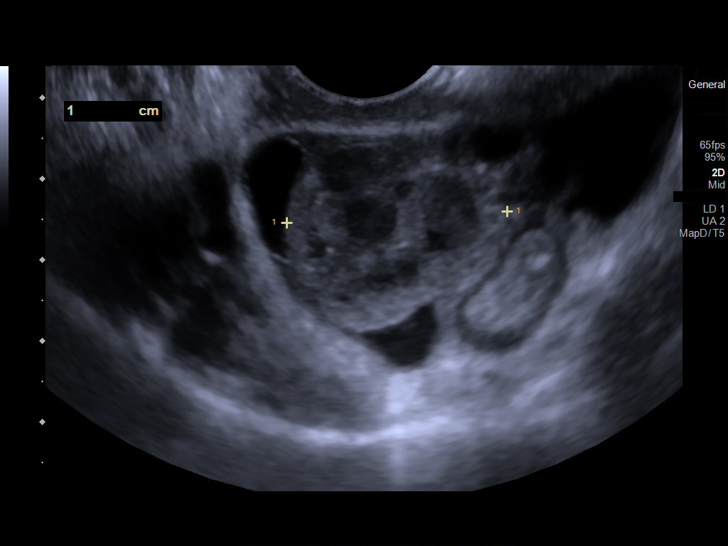

[13 of 25 positions shown; findings below may reference images not displayed]

FINDINGS: Uterus

Measurements: 7.4 x 3.5 x 4.7 cm = volume: 64 mL. Anteverted. Normal
morphology without mass.

Endometrium

Thickness: 5 mm.  No endometrial fluid or focal abnormality

Right ovary

Measurements: 5.4 x 2.4 x 2.8 cm = volume: 18.5 mL. Small
hemorrhagic corpus luteum. Otherwise normal morphology without mass.
Blood flow present within RIGHT ovary on color Doppler imaging.

Left ovary

Measurements: 2.7 x 2.2 x 1.4 cm = volume: 4.4 mL. Normal morphology
without mass. Blood flow present within LEFT ovary on color Doppler
imaging.

Pulsed Doppler evaluation of both ovaries demonstrates low
resistance arterial and venous waveforms within both ovaries.

Other findings

Small amount of nonspecific free pelvic fluid.  No adnexal masses.
IMPRESSION: Small hemorrhagic corpus luteum RIGHT ovary.

Otherwise normal exam.
